# Patient Record
Sex: Male | Born: 1955 | Race: White | Hispanic: No | Marital: Married | Smoking: Never smoker
Health system: Southern US, Community
[De-identification: ages and names within clinical notes are randomized; demographics above are authoritative.]

## PROBLEM LIST (undated history)

## (undated) DIAGNOSIS — I10 Essential (primary) hypertension: Secondary | ICD-10-CM

## (undated) DIAGNOSIS — E785 Hyperlipidemia, unspecified: Secondary | ICD-10-CM

## (undated) HISTORY — PX: APPENDECTOMY: SHX54

## (undated) HISTORY — PX: TONSILLECTOMY: SUR1361

## (undated) HISTORY — PX: HERNIA REPAIR: SHX51

## (undated) HISTORY — PX: DIAGNOSTIC LAPAROSCOPY: SUR761

## (undated) HISTORY — PX: SHOULDER ARTHROSCOPY: SHX128

## (undated) HISTORY — PX: CHOLECYSTECTOMY: SHX55

---

## 1999-05-06 ENCOUNTER — Ambulatory Visit (HOSPITAL_COMMUNITY): Admission: RE | Admit: 1999-05-06 | Discharge: 1999-05-06 | Payer: Self-pay | Admitting: General Surgery

## 1999-05-06 ENCOUNTER — Encounter: Payer: Self-pay | Admitting: General Surgery

## 2000-03-15 ENCOUNTER — Encounter (INDEPENDENT_AMBULATORY_CARE_PROVIDER_SITE_OTHER): Payer: Self-pay | Admitting: *Deleted

## 2000-03-15 ENCOUNTER — Ambulatory Visit (HOSPITAL_BASED_OUTPATIENT_CLINIC_OR_DEPARTMENT_OTHER): Admission: RE | Admit: 2000-03-15 | Discharge: 2000-03-15 | Payer: Self-pay | Admitting: Orthopedic Surgery

## 2000-12-21 ENCOUNTER — Ambulatory Visit (HOSPITAL_BASED_OUTPATIENT_CLINIC_OR_DEPARTMENT_OTHER): Admission: RE | Admit: 2000-12-21 | Discharge: 2000-12-21 | Payer: Self-pay | Admitting: Orthopedic Surgery

## 2003-12-02 ENCOUNTER — Ambulatory Visit (HOSPITAL_COMMUNITY): Admission: RE | Admit: 2003-12-02 | Discharge: 2003-12-02 | Payer: Self-pay | Admitting: Orthopedic Surgery

## 2003-12-02 ENCOUNTER — Ambulatory Visit (HOSPITAL_BASED_OUTPATIENT_CLINIC_OR_DEPARTMENT_OTHER): Admission: RE | Admit: 2003-12-02 | Discharge: 2003-12-02 | Payer: Self-pay | Admitting: Orthopedic Surgery

## 2004-01-14 ENCOUNTER — Ambulatory Visit (HOSPITAL_COMMUNITY): Admission: RE | Admit: 2004-01-14 | Discharge: 2004-01-14 | Payer: Self-pay | Admitting: Gastroenterology

## 2004-01-14 ENCOUNTER — Encounter (INDEPENDENT_AMBULATORY_CARE_PROVIDER_SITE_OTHER): Payer: Self-pay | Admitting: Specialist

## 2006-07-19 ENCOUNTER — Encounter (INDEPENDENT_AMBULATORY_CARE_PROVIDER_SITE_OTHER): Payer: Self-pay | Admitting: Specialist

## 2006-07-19 ENCOUNTER — Ambulatory Visit (HOSPITAL_BASED_OUTPATIENT_CLINIC_OR_DEPARTMENT_OTHER): Admission: RE | Admit: 2006-07-19 | Discharge: 2006-07-19 | Payer: Self-pay | Admitting: Orthopedic Surgery

## 2010-11-14 ENCOUNTER — Ambulatory Visit
Admission: RE | Admit: 2010-11-14 | Discharge: 2010-11-14 | Payer: Self-pay | Source: Home / Self Care | Attending: General Surgery | Admitting: General Surgery

## 2011-04-21 NOTE — Op Note (Signed)
[place]. [facility]  Patient:    Donald Castaneda, Donald Castaneda                         MRN: [id number] Proc. Date: 03/15/00 Attending:  Nicki Reaper, M.D. Dictator:   Nicki Reaper, M.D. CC:         Nicki Reaper, M.D. 2 copies                           Operative Report  PREOPERATIVE DIAGNOSIS:  Mass, right middle finger.  POSTOPERATIVE DIAGNOSIS:  Mass, right middle finger.  OPERATION:  Excision of mass of right middle finger.  SURGEON:  Dr. Merlyn Lot.  ASSISTANT:  Talmadge Chad.  ANESTHESIA:  Metacarpal block, 1% Xylocaine, 0.25% Marcaine, 8 cc was used.  HISTORY OF PRESENT ILLNESS:  The patient is a 55 year old male with a history of a mass on the dorsal aspect of his finger which has gotten larger and smaller. He desires having this removed.  DESCRIPTION OF PROCEDURE:  The patient was brought to the operating room where  metacarpal block was given with 1% Xylocaine and 0.25% Marcaine, both without epinephrine.  He was prepped and draped using Duraprep.  A Penrose drain was used for tourniquet control at the base of the finger.  A curvilinear incision was made with an ellipse of the mass, carried down through the subcutaneous tissue with sharp dissection.  The entire mass was removed and sent to pathology.  The wound was irrigated.  The skin was closed with interrupted 5-0 Nylon suture after undermining the skin proximally.  A sterile compressive dressing and splint was  applied.  The patient tolerated the procedure well.  He was discharged home to return in one week on Tylenol No. 3 and Keflex. DD:  03/15/00 TD:  03/15/00 Job: [id number]

## 2011-04-21 NOTE — Op Note (Signed)
NAME:  Donald Castaneda, Donald Castaneda                          ACCOUNT NO.:  [id number]   MEDICAL RECORD NO.:  [id number]                   PATIENT TYPE:  AMB   LOCATION:  DSC                                  FACILITY:  [facility]   PHYSICIAN:  Feliberto Gottron. Turner Daniels, M.D.                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   POSTOPERATIVE DIAGNOSES:  Left shoulder impingement syndrome with  subacromial spur.   OPERATION PERFORMED:  Left shoulder arthroscopic anterior inferior  acromioplasty and debridement of incidental small labral tear superior  anterior.   SURGEON:  Feliberto Gottron. Turner Daniels, M.D.   ASSISTANT:  None.   ANESTHESIA:  Interscalene block plus general endotracheal.   ESTIMATED BLOOD LOSS:  Minimal.   FLUIDS REPLACED:  1L of crystalloid.   DRAINS:  None.   TOURNIQUET TIME:  None.   INDICATIONS FOR PROCEDURE:  The patient is a 55 year old gentleman whom I  have followed for left shoulder impingement syndrome for I believe three  years.  He has had a couple of cortisone injections with good temporary  response but the last one failed early.  The subacromial spur on plain x-  rays only about 0.5 cm in size but he has significant classic impingement  symptoms and again he has failed conservative treatment now for three years.  He desired elective arthroscopic anterior inferior acromioplasty, evaluation  of the rotator cuff although an MRI scan only showed a partial supraspinatus  insertion tear at worst.  Preoperatively, the patient was noted to have a  hemoglobin of 9, donated blood a couple of months ago so his hemoglobin was  above 11 and he is going to be worked up for this.   DESCRIPTION OF PROCEDURE:  The patient was identified by arm band and taken  to the operating room at [facility] where  appropriate  anesthetic monitors are attached and left shoulder interscalene block  anesthesia induced followed by general endotracheal anesthesia.  The left  upper extremity was then prepped and draped in the usual sterile fashion  from the wrist to the hemithorax.  Using a #11 blade, standard portals were  made 1.5 cm anterior to the acromioclavicular joint lateral to the junction  of the middle and posterior thirds of the acromion and posterior to the  posterolateral corner of the acromion process.  The inflow was placed  anteriorly, the arthroscope laterally and a 4.2 great white sucker shaver  posteriorly allowing subacromial bursectomy outlining the subacromial spur  which actually appeared to be about a full centimeter in size after it had  been cleaned of its soft tissues.  This was then removed with a 4.5 hooded  Vortex bur, decompressing the subacromial space which was then evaluated  showing a 5% partial thickness, supraspinatus insertion tear that was  debrided lightly with a 4.2 great white sucker shaver.  The [facility] joint was  noted to be relatively clean.  The arthroscope was then repositioned in the  glenohumeral joint which was normal except for a small superior anterior  labral tear, degenerative in nature, maybe 1 to 2% of the fibers were  removed incidentally.  The articular cartilage was in good condition.  The  biceps anchor was intact and the labrum overall was intact.  At this point  the shoulder was washed out with normal saline solution.  The arthroscopic  instruments were removed.  A dressing of Xeroform, 4 x 4 dressing sponges  and paper tape and a standard sling applied.  Patient awakened and taken to  the recovery room without difficulty.                                               Feliberto Gottron. Turner Daniels, M.D.    Ovid Curd  D:  12/02/2003  T:  12/02/2003  Job:  [id number]   cc:   Theressa Millard, M.D.  [address] [place]  [place] [address]  Fax: [phone]

## 2011-04-21 NOTE — Op Note (Signed)
[place]. [facility]  Patient:    Donald Castaneda, Donald Castaneda                       MRN: [id number] Proc. Date: 02/18/01 Adm. Date:  [id number] Disc. Date: [id number] Attending:  Alinda Deem                           Operative Report  PREOPERATIVE DIAGNOSIS:  Right medial meniscus tear.  POSTOPERATIVE DIAGNOSIS:  Right medial meniscus tear.  OPERATION PERFORMED:  Right knee partial arthroscopic medial meniscectomy.  SURGEON:  Alinda Deem, M.D.  ASSISTANT:  None.  ANESTHESIA:  Local with IV sedation.  ESTIMATED BLOOD LOSS:  Minimal.  FLUID REPLACEMENT:  500 cc crystalloid.  DRAINS:  None.  TOURNIQUET TIME:  None.  INDICATIONS FOR PROCEDURE:  The patient is a 55 year old man followed for right knee pain for many months with continued pain and tenderness along the medial joint line, positive McMurrays test, failed conservative treatment with anti-inflammatory medicines, physical therapy and rest and now desires right knee arthroscopic evaluation and presumed medial meniscectomy.  DESCRIPTION OF PROCEDURE:  The patient was identified by arm band and taken to the operating room at [facility] where the appropriate anesthetic monitors were attached and local anesthesia with IV sedation induced into the right knee.  A lateral post was applied to the operative table.  The lower extremity was prepped and draped in the usual sterile fashion from the ankle to the midthigh.  Using a #11 blade, standard inferomedial, inferolateral peripatellar portals were then made and the arthroscope was then introduced through the inferolateral portal, outflow through the inferomedial portal. The suprapatellar pouch, patella and trochlea were in excellent condition as were the cruciate ligaments and the lateral compartment along with the gutters.  On the medial side we identified a complex tear of the medial meniscus which was debrided back to a stable margin  using the the sucker shaver barracuda from [employer] as well as straight biters.  Satisfied with the trimming of the cartilage back to a stable margin, the knee was the washed out with normal saline solution.  The arthroscopic instruments were removed. A dressing of Xeroform, 4 x 8 dressing sponges, Webril and an Ace wrap applied.  The patient was awakened and taken to the recovery room without difficulty. DD:  02/18/01 TD:  02/18/01 Job: [id number]

## 2011-04-21 NOTE — Op Note (Signed)
NAMEELWARD, NOCERA                ACCOUNT NO.:  [id number]   MEDICAL RECORD NO.:  [id number]          PATIENT TYPE:  AMB   LOCATION:  DSC                          FACILITY:  [facility]   PHYSICIAN:  Cindee Salt, M.D.       DATE OF BIRTH:  08-01-1956   DATE OF PROCEDURE:  07/19/2006  DATE OF DISCHARGE:                                 OPERATIVE REPORT   PREOPERATIVE DIAGNOSIS:  Mass, right palm.   POSTOPERATIVE DIAGNOSIS:  Mass, right palm.   OPERATION:  Excision, mass, right palm.   SURGEON:  Merlyn Lot, M.D.   ASSISTANTCarolyne Fiscal, R.N.   ANESTHESIA:  Forearm-based IV regional.   HISTORY:  The patient is a 55 year old male with a history of a mass, palmar  aspect of his right hand on the index finger.  This has been uncomfortable  for him.  He is desirous of removal.  He is aware of risks and complications  including injury to arteries, nerves, tendons, incomplete relief of  symptoms, dystrophy and recurrence.  In the preoperative area, the mass was  marked by both the patient and surgeon.   PROCEDURE:  The patient was brought to operating room.  A forearm-based IV  regional anesthetic carried out without difficulty.  Was prepped using  DuraPrep, supine position, right arm free.  After a 3-minute dry time, he  was draped.  The oblique incision was made over the mass, carried down  through subcutaneous tissue.  A small pea-sized mass was immediately  encountered.  This appeared to be a fibrofatty type mass which was very  firm, well demarcated from the remaining fatty tissue.  This was removed and  sent to pathology.  The dissection was carried down through the palmar  fascia, the flexor tendon observed.  No ganglion cyst or other masses were  identified.  The wound was irrigated.  Skin closed with interrupted 5-0  nylon sutures.  Sterile compressive dressing was applied.  The patient  tolerated the procedure well and was taken to the recovery for observation  in satisfactory condition.   He is discharged home to return to the [facility] in 1 week on Vicodin.           ______________________________  Cindee Salt, M.D.     GK/MEDQ  D:  07/19/2006  T:  07/19/2006  Job:  [id number]

## 2011-04-21 NOTE — Op Note (Signed)
NAME:  Donald Castaneda, Donald Castaneda                          ACCOUNT NO.:  [id number]   MEDICAL RECORD NO.:  [id number]                   PATIENT TYPE:  AMB   LOCATION:  ENDO                                 FACILITY:  [facility]   PHYSICIAN:  Petra Kuba, M.D.                 DATE OF BIRTH:  1956/12/04   DATE OF PROCEDURE:  01/14/2004  DATE OF DISCHARGE:                                 OPERATIVE REPORT   PROCEDURE PERFORMED:  Esophagogastroduodenoscopy with biopsy.   ENDOSCOPIST:  Petra Kuba, M.D.   INDICATIONS FOR PROCEDURE:  Iron deficiency, nondiagnostic colonoscopy.  Consent was signed prior to any premeds given, after the risks, benefits,  methods and options were thoroughly discussed in the office.   MEDICINES USED:  No additional medicines were used for this procedure since  it followed the colonoscopy.   DESCRIPTION OF PROCEDURE:  The video endoscope was inserted by direct  vision.  Esophagus was normal.  Scope passed into the stomach through a  normal antrum and normal pylorus into a normal duodenal bulb and around the  C-loop to a normal second portion of the duodenum.  The ampulla was seen on  insertion.  Two distal duodenal biopsies were obtained and no abnormality  was seen.  The scope was withdrawn back to the bulb which again confirmed  its normal findings.  Scope was withdrawn back to the stomach and  retroflexed.  The angularis, cardia, fundus, lesser and greater curve were  normal on retroflex visualization.  Straight visualization in the stomach  did not reveal any additional findings.  Air was suctioned, scope was slowly  withdrawn.  Again, a good look at the esophagus was normal.  Scope was  removed.  The patient tolerated the procedure well.  There were no obvious  immediate complication.   ENDOSCOPIC DIAGNOSIS:  Essentially normal esophagogastroduodenoscopy, status  post duodenal biopsy to rule out malabsorption.   PLAN:  Go ahead and get a CBC and hemoglobin  electrophoresis.  Probably  begin iron.  Follow-up in six to eight weeks to recheck symptoms, CBC and  guaiac and decide any further work-up and plan like a small bowel follow-  through, CT or capsule endoscopy.                                               Petra Kuba, M.D.    MEM/MEDQ  D:  01/14/2004  T:  01/14/2004  Job:  [id number]   cc:   Theressa Millard, M.D.  [address] [place]  [place] [address]  Fax: [phone]

## 2011-04-21 NOTE — Op Note (Signed)
NAME:  Donald Castaneda, Donald Castaneda                          ACCOUNT NO.:  [id number]   MEDICAL RECORD NO.:  [id number]                   PATIENT TYPE:  AMB   LOCATION:  ENDO                                 FACILITY:  [facility]   PHYSICIAN:  Petra Kuba, M.D.                 DATE OF BIRTH:  [birth year]   DATE OF PROCEDURE:  01/14/2004  DATE OF DISCHARGE:                                 OPERATIVE REPORT   PROCEDURE PERFORMED:  Colonoscopy with polypectomy and hot biopsy.   ENDOSCOPIST:  Petra Kuba, M.D.   INDICATIONS FOR PROCEDURE:  Patient with iron deficiency.  Patient due for  colonic screening.  Consent was signed after the risks, benefits, methods and options were  thoroughly discussed in the office.   MEDICINES USED:  Demerol 70 mg, Versed 7 mg.   DESCRIPTION OF PROCEDURE:  Rectal inspection was pertinent for external  hemorrhoids, small.  Digital exam was negative.  A video pediatric  adjustable colonoscope was inserted and easily advanced to approximately the  midtransverse where a small polyp was seen and hot biopsied times one.  The  scope was then advanced to the cecum using abdominal pressure.  No other  abnormalities were seen on insertion.  No blood was seen.  The cecum was  identified by the appendiceal orifice and the ileocecal valve.  In fact the  scope was inserted a short ways into the terminal ileum which was normal.  Photodocumentation was obtained.  The scope was slowly withdrawn.  On slow  withdrawal through the colon, the cecum and the ascending were normal.  We  withdrew back to the previously seen polyp.  No obvious residual polyp was  seen.  There was a nice white coagulum without any bleeding.  The scope was  further withdrawn.  In the mid descending another tiny to small polyp was  seen and hot biopsied times one and put in a separate container.  The scope  was further withdrawn.  No additional findings were seen until we withdrew  back to the rectum where anorectal  pullthrough and retroflexion confirmed  some small hemorrhoids.  The scope was straightened and advanced a short  ways up the left side of the colon. Air was suctioned, scope removed.  The  patient tolerated the procedure well.  There was no immediate obvious  complication.   ENDOSCOPIC DIAGNOSIS:  1. Internal and external hemorrhoids.  2. Two small polyps descending and transverse hot biopsied.  3. Otherwise within normal limits to the terminal ileum.   PLAN:  Await pathology.  Probably recheck colon screening in five years.  Continue work-up with an EGD.  Please see that dictation for further work-up  and plans.  Petra Kuba, M.D.    MEM/MEDQ  D:  01/14/2004  T:  01/14/2004  Job:  [id number]   cc:   Theressa Millard, M.D.  [address] [place]  [place] [address]  Fax: [phone]

## 2014-02-26 ENCOUNTER — Other Ambulatory Visit: Payer: Self-pay | Admitting: Gastroenterology

## 2016-11-15 DIAGNOSIS — E7800 Pure hypercholesterolemia, unspecified: Secondary | ICD-10-CM | POA: Diagnosis not present

## 2016-11-15 DIAGNOSIS — Z0000 Encounter for general adult medical examination without abnormal findings: Secondary | ICD-10-CM | POA: Diagnosis not present

## 2016-11-15 DIAGNOSIS — D508 Other iron deficiency anemias: Secondary | ICD-10-CM | POA: Diagnosis not present

## 2016-11-15 DIAGNOSIS — Z125 Encounter for screening for malignant neoplasm of prostate: Secondary | ICD-10-CM | POA: Diagnosis not present

## 2016-12-01 DIAGNOSIS — H5213 Myopia, bilateral: Secondary | ICD-10-CM | POA: Diagnosis not present

## 2017-11-15 DIAGNOSIS — Z0000 Encounter for general adult medical examination without abnormal findings: Secondary | ICD-10-CM | POA: Diagnosis not present

## 2017-11-15 DIAGNOSIS — E7800 Pure hypercholesterolemia, unspecified: Secondary | ICD-10-CM | POA: Diagnosis not present

## 2017-11-15 DIAGNOSIS — Z125 Encounter for screening for malignant neoplasm of prostate: Secondary | ICD-10-CM | POA: Diagnosis not present

## 2017-11-15 DIAGNOSIS — D508 Other iron deficiency anemias: Secondary | ICD-10-CM | POA: Diagnosis not present

## 2017-12-05 DIAGNOSIS — H2513 Age-related nuclear cataract, bilateral: Secondary | ICD-10-CM | POA: Diagnosis not present

## 2017-12-05 DIAGNOSIS — H5213 Myopia, bilateral: Secondary | ICD-10-CM | POA: Diagnosis not present

## 2018-11-19 DIAGNOSIS — Z0000 Encounter for general adult medical examination without abnormal findings: Secondary | ICD-10-CM | POA: Diagnosis not present

## 2018-11-19 DIAGNOSIS — D508 Other iron deficiency anemias: Secondary | ICD-10-CM | POA: Diagnosis not present

## 2018-11-19 DIAGNOSIS — R030 Elevated blood-pressure reading, without diagnosis of hypertension: Secondary | ICD-10-CM | POA: Diagnosis not present

## 2018-11-19 DIAGNOSIS — E7800 Pure hypercholesterolemia, unspecified: Secondary | ICD-10-CM | POA: Diagnosis not present

## 2018-12-17 DIAGNOSIS — H5213 Myopia, bilateral: Secondary | ICD-10-CM | POA: Diagnosis not present

## 2018-12-17 DIAGNOSIS — H2513 Age-related nuclear cataract, bilateral: Secondary | ICD-10-CM | POA: Diagnosis not present

## 2018-12-25 DIAGNOSIS — R030 Elevated blood-pressure reading, without diagnosis of hypertension: Secondary | ICD-10-CM | POA: Diagnosis not present

## 2019-09-09 DIAGNOSIS — R69 Illness, unspecified: Secondary | ICD-10-CM | POA: Diagnosis not present

## 2019-10-16 DIAGNOSIS — R69 Illness, unspecified: Secondary | ICD-10-CM | POA: Diagnosis not present

## 2019-11-10 DIAGNOSIS — E7800 Pure hypercholesterolemia, unspecified: Secondary | ICD-10-CM | POA: Diagnosis not present

## 2019-11-10 DIAGNOSIS — Z8042 Family history of malignant neoplasm of prostate: Secondary | ICD-10-CM | POA: Diagnosis not present

## 2019-11-10 DIAGNOSIS — D508 Other iron deficiency anemias: Secondary | ICD-10-CM | POA: Diagnosis not present

## 2019-11-10 DIAGNOSIS — Z5181 Encounter for therapeutic drug level monitoring: Secondary | ICD-10-CM | POA: Diagnosis not present

## 2019-11-10 DIAGNOSIS — Z23 Encounter for immunization: Secondary | ICD-10-CM | POA: Diagnosis not present

## 2019-11-10 DIAGNOSIS — Z125 Encounter for screening for malignant neoplasm of prostate: Secondary | ICD-10-CM | POA: Diagnosis not present

## 2019-11-10 DIAGNOSIS — Z1389 Encounter for screening for other disorder: Secondary | ICD-10-CM | POA: Diagnosis not present

## 2019-11-10 DIAGNOSIS — Z0000 Encounter for general adult medical examination without abnormal findings: Secondary | ICD-10-CM | POA: Diagnosis not present

## 2019-12-12 DIAGNOSIS — M67911 Unspecified disorder of synovium and tendon, right shoulder: Secondary | ICD-10-CM | POA: Diagnosis not present

## 2019-12-23 DIAGNOSIS — H5213 Myopia, bilateral: Secondary | ICD-10-CM | POA: Diagnosis not present

## 2019-12-23 DIAGNOSIS — H2513 Age-related nuclear cataract, bilateral: Secondary | ICD-10-CM | POA: Diagnosis not present

## 2020-01-04 ENCOUNTER — Ambulatory Visit: Payer: Self-pay

## 2020-01-09 DIAGNOSIS — M25511 Pain in right shoulder: Secondary | ICD-10-CM | POA: Diagnosis not present

## 2020-01-10 ENCOUNTER — Ambulatory Visit: Payer: Medicare HMO | Attending: Internal Medicine

## 2020-01-10 DIAGNOSIS — Z23 Encounter for immunization: Secondary | ICD-10-CM | POA: Insufficient documentation

## 2020-01-10 NOTE — Progress Notes (Signed)
   Covid-19 Vaccination Clinic  Name:  MARVIN GRABILL    MRN: [id number] DOB: 30-Nov-1956  01/10/2020  Mr. Pflaum was observed post Covid-19 immunization for 15 minutes without incidence. He was provided with Vaccine Information Sheet and instruction to access the V-Safe system.   Mr. Roseboom was instructed to call 911 with any severe reactions post vaccine: Marland Kitchen Difficulty breathing  . Swelling of your face and throat  . A fast heartbeat  . A bad rash all over your body  . Dizziness and weakness    Immunizations Administered    Name Date Dose VIS Date Route   Pfizer COVID-19 Vaccine 01/10/2020 10:10 AM 0.3 mL 11/14/2019 Intramuscular   Manufacturer: [employer], [employer]   Lot: [id number]   NDC: [address]-5

## 2020-01-12 DIAGNOSIS — M75121 Complete rotator cuff tear or rupture of right shoulder, not specified as traumatic: Secondary | ICD-10-CM | POA: Diagnosis not present

## 2020-01-13 ENCOUNTER — Other Ambulatory Visit: Payer: Self-pay | Admitting: Orthopedic Surgery

## 2020-01-15 ENCOUNTER — Ambulatory Visit: Payer: Self-pay

## 2020-01-19 ENCOUNTER — Other Ambulatory Visit: Payer: Self-pay

## 2020-01-19 ENCOUNTER — Encounter (HOSPITAL_BASED_OUTPATIENT_CLINIC_OR_DEPARTMENT_OTHER): Payer: Self-pay | Admitting: Orthopedic Surgery

## 2020-01-22 ENCOUNTER — Other Ambulatory Visit (HOSPITAL_COMMUNITY): Admission: RE | Admit: 2020-01-22 | Payer: Medicare HMO | Source: Ambulatory Visit

## 2020-01-23 ENCOUNTER — Other Ambulatory Visit (HOSPITAL_COMMUNITY)
Admission: RE | Admit: 2020-01-23 | Discharge: 2020-01-23 | Disposition: A | Discharge status: Home / Self Care | Payer: Medicare HMO | Source: Ambulatory Visit | Attending: Orthopedic Surgery | Admitting: Orthopedic Surgery

## 2020-01-23 DIAGNOSIS — Z01812 Encounter for preprocedural laboratory examination: Secondary | ICD-10-CM | POA: Diagnosis not present

## 2020-01-23 DIAGNOSIS — Z20822 Contact with and (suspected) exposure to covid-19: Secondary | ICD-10-CM | POA: Insufficient documentation

## 2020-01-23 LAB — SARS CORONAVIRUS 2 (TAT 6-24 HRS): SARS Coronavirus 2: NEGATIVE

## 2020-01-26 ENCOUNTER — Ambulatory Visit (HOSPITAL_BASED_OUTPATIENT_CLINIC_OR_DEPARTMENT_OTHER): Payer: Medicare HMO | Admitting: Anesthesiology

## 2020-01-26 ENCOUNTER — Other Ambulatory Visit: Payer: Self-pay

## 2020-01-26 ENCOUNTER — Ambulatory Visit (HOSPITAL_BASED_OUTPATIENT_CLINIC_OR_DEPARTMENT_OTHER)
Admission: RE | Admit: 2020-01-26 | Discharge: 2020-01-26 | Disposition: A | Discharge status: Home / Self Care | Payer: Medicare HMO | Attending: Orthopedic Surgery | Admitting: Orthopedic Surgery

## 2020-01-26 ENCOUNTER — Encounter (HOSPITAL_BASED_OUTPATIENT_CLINIC_OR_DEPARTMENT_OTHER)
Admission: RE | Disposition: A | Discharge status: Home / Self Care | Payer: Self-pay | Source: Home / Self Care | Attending: Orthopedic Surgery

## 2020-01-26 ENCOUNTER — Encounter (HOSPITAL_BASED_OUTPATIENT_CLINIC_OR_DEPARTMENT_OTHER): Payer: Self-pay | Admitting: Orthopedic Surgery

## 2020-01-26 DIAGNOSIS — X58XXXA Exposure to other specified factors, initial encounter: Secondary | ICD-10-CM | POA: Diagnosis not present

## 2020-01-26 DIAGNOSIS — S46111A Strain of muscle, fascia and tendon of long head of biceps, right arm, initial encounter: Secondary | ICD-10-CM | POA: Insufficient documentation

## 2020-01-26 DIAGNOSIS — S43431A Superior glenoid labrum lesion of right shoulder, initial encounter: Secondary | ICD-10-CM | POA: Insufficient documentation

## 2020-01-26 DIAGNOSIS — M7541 Impingement syndrome of right shoulder: Secondary | ICD-10-CM | POA: Diagnosis not present

## 2020-01-26 DIAGNOSIS — M75121 Complete rotator cuff tear or rupture of right shoulder, not specified as traumatic: Secondary | ICD-10-CM | POA: Insufficient documentation

## 2020-01-26 DIAGNOSIS — Z79899 Other long term (current) drug therapy: Secondary | ICD-10-CM | POA: Diagnosis not present

## 2020-01-26 DIAGNOSIS — G8918 Other acute postprocedural pain: Secondary | ICD-10-CM | POA: Diagnosis not present

## 2020-01-26 DIAGNOSIS — E785 Hyperlipidemia, unspecified: Secondary | ICD-10-CM | POA: Diagnosis not present

## 2020-01-26 HISTORY — PX: SHOULDER ARTHROSCOPY WITH ROTATOR CUFF REPAIR AND SUBACROMIAL DECOMPRESSION: SHX5686

## 2020-01-26 HISTORY — DX: Hyperlipidemia, unspecified: E78.5

## 2020-01-26 SURGERY — SHOULDER ARTHROSCOPY WITH ROTATOR CUFF REPAIR AND SUBACROMIAL DECOMPRESSION
Anesthesia: General | Site: Shoulder | Laterality: Right

## 2020-01-26 MED ORDER — BUPIVACAINE-EPINEPHRINE (PF) 0.5% -1:200000 IJ SOLN
INTRAMUSCULAR | Status: DC | PRN
  Administered 2020-01-26: 15 mL via PERINEURAL

## 2020-01-26 MED ORDER — ACETAMINOPHEN 500 MG PO TABS
ORAL_TABLET | ORAL | Status: AC
  Filled 2020-01-26: qty 2

## 2020-01-26 MED ORDER — PROPOFOL 10 MG/ML IV BOLUS
INTRAVENOUS | Status: DC | PRN
  Administered 2020-01-26: 150 mg via INTRAVENOUS

## 2020-01-26 MED ORDER — FENTANYL CITRATE (PF) 100 MCG/2ML IJ SOLN
50.0000 ug | INTRAMUSCULAR | Status: DC | PRN
  Administered 2020-01-26: 50 ug via INTRAVENOUS

## 2020-01-26 MED ORDER — CEFAZOLIN SODIUM-DEXTROSE 2-4 GM/100ML-% IV SOLN
INTRAVENOUS | Status: AC
  Filled 2020-01-26: qty 100

## 2020-01-26 MED ORDER — DEXAMETHASONE SODIUM PHOSPHATE 4 MG/ML IJ SOLN
INTRAMUSCULAR | Status: DC | PRN
  Administered 2020-01-26: 4 mg via INTRAVENOUS

## 2020-01-26 MED ORDER — LIDOCAINE HCL (CARDIAC) PF 100 MG/5ML IV SOSY
PREFILLED_SYRINGE | INTRAVENOUS | Status: DC | PRN
  Administered 2020-01-26: 20 mg via INTRAVENOUS

## 2020-01-26 MED ORDER — TIZANIDINE HCL 4 MG PO TABS: 4.0000 mg | ORAL_TABLET | Freq: Three times a day (TID) | ORAL | 1 refills | Status: AC | PRN

## 2020-01-26 MED ORDER — CELECOXIB 200 MG PO CAPS
200.0000 mg | ORAL_CAPSULE | Freq: Once | ORAL | Status: AC
  Administered 2020-01-26: 200 mg via ORAL

## 2020-01-26 MED ORDER — MIDAZOLAM HCL 2 MG/2ML IJ SOLN
INTRAMUSCULAR | Status: AC
  Filled 2020-01-26: qty 2

## 2020-01-26 MED ORDER — MIDAZOLAM HCL 2 MG/2ML IJ SOLN
1.0000 mg | INTRAMUSCULAR | Status: DC | PRN
  Administered 2020-01-26: 11:00:00 2 mg via INTRAVENOUS

## 2020-01-26 MED ORDER — CEFAZOLIN SODIUM-DEXTROSE 2-4 GM/100ML-% IV SOLN
2.0000 g | INTRAVENOUS | Status: AC
  Administered 2020-01-26: 2 g via INTRAVENOUS

## 2020-01-26 MED ORDER — LACTATED RINGERS IV SOLN: INTRAVENOUS | Status: DC

## 2020-01-26 MED ORDER — BUPIVACAINE-EPINEPHRINE (PF) 0.5% -1:200000 IJ SOLN: INTRAMUSCULAR | Status: DC | PRN

## 2020-01-26 MED ORDER — BUPIVACAINE LIPOSOME 1.3 % IJ SUSP
INTRAMUSCULAR | Status: DC | PRN
  Administered 2020-01-26: 10 mL via PERINEURAL

## 2020-01-26 MED ORDER — ACETAMINOPHEN 500 MG PO TABS
1000.0000 mg | ORAL_TABLET | Freq: Once | ORAL | Status: AC
  Administered 2020-01-26: 1000 mg via ORAL

## 2020-01-26 MED ORDER — ONDANSETRON HCL 4 MG/2ML IJ SOLN
INTRAMUSCULAR | Status: AC
  Filled 2020-01-26: qty 2

## 2020-01-26 MED ORDER — PROPOFOL 10 MG/ML IV BOLUS
INTRAVENOUS | Status: AC
  Filled 2020-01-26: qty 20

## 2020-01-26 MED ORDER — LIDOCAINE 2% (20 MG/ML) 5 ML SYRINGE
INTRAMUSCULAR | Status: AC
  Filled 2020-01-26: qty 5

## 2020-01-26 MED ORDER — OXYCODONE-ACETAMINOPHEN 5-325 MG PO TABS: ORAL_TABLET | ORAL | 0 refills | Status: AC

## 2020-01-26 MED ORDER — ONDANSETRON HCL 4 MG/2ML IJ SOLN
INTRAMUSCULAR | Status: DC | PRN
  Administered 2020-01-26: 4 mg via INTRAVENOUS

## 2020-01-26 MED ORDER — FENTANYL CITRATE (PF) 100 MCG/2ML IJ SOLN
INTRAMUSCULAR | Status: AC
  Filled 2020-01-26: qty 2

## 2020-01-26 MED ORDER — ONDANSETRON HCL 4 MG/2ML IJ SOLN: 4.0000 mg | Freq: Once | INTRAMUSCULAR | Status: DC | PRN

## 2020-01-26 MED ORDER — FENTANYL CITRATE (PF) 100 MCG/2ML IJ SOLN: 25.0000 ug | INTRAMUSCULAR | Status: DC | PRN

## 2020-01-26 MED ORDER — CELECOXIB 200 MG PO CAPS
ORAL_CAPSULE | ORAL | Status: AC
  Filled 2020-01-26: qty 1

## 2020-01-26 MED ORDER — SODIUM CHLORIDE 0.9 % IR SOLN
Status: DC | PRN
  Administered 2020-01-26: 6000 mL

## 2020-01-26 MED ORDER — SUCCINYLCHOLINE CHLORIDE 20 MG/ML IJ SOLN
INTRAMUSCULAR | Status: DC | PRN
  Administered 2020-01-26: 100 mg via INTRAVENOUS

## 2020-01-26 MED ORDER — CHLORHEXIDINE GLUCONATE 4 % EX LIQD: 60.0000 mL | Freq: Once | CUTANEOUS | Status: DC

## 2020-01-26 MED ORDER — DEXAMETHASONE SODIUM PHOSPHATE 10 MG/ML IJ SOLN
INTRAMUSCULAR | Status: AC
  Filled 2020-01-26: qty 1

## 2020-01-26 SURGICAL SUPPLY — 59 items
AID PSTN UNV HD RSTRNT DISP (MISCELLANEOUS) ×1
ANCH SUT SWLK 19.1X4.75 VT (Anchor) ×4 IMPLANT
ANCHOR PEEK 4.75X19.1 SWLK C (Anchor) ×8 IMPLANT
APL PRP STRL LF DISP 70% ISPRP (MISCELLANEOUS) ×1
BENZOIN TINCTURE PRP APPL 2/3 (GAUZE/BANDAGES/DRESSINGS) IMPLANT
BURR OVAL 8 FLU 4.0X13 (MISCELLANEOUS) ×2 IMPLANT
CANNULA 5.75X7 CRYSTAL CLEAR (CANNULA) ×2 IMPLANT
CANNULA TWIST IN 8.25X7CM (CANNULA) ×2 IMPLANT
CHLORAPREP W/TINT 26 (MISCELLANEOUS) ×2 IMPLANT
COVER WAND RF STERILE (DRAPES) IMPLANT
CUTTER BONE 4.0MM X 13CM (MISCELLANEOUS) ×2 IMPLANT
DECANTER SPIKE VIAL GLASS SM (MISCELLANEOUS) IMPLANT
DRAPE IMP U-DRAPE 54X76 (DRAPES) ×2 IMPLANT
DRAPE INCISE IOBAN 66X45 STRL (DRAPES) ×2 IMPLANT
DRAPE STERI 35X30 U-POUCH (DRAPES) ×2 IMPLANT
DRAPE SURG 17X23 STRL (DRAPES) ×2 IMPLANT
DRAPE U-SHAPE 47X51 STRL (DRAPES) ×2 IMPLANT
DRAPE U-SHAPE 76X120 STRL (DRAPES) ×4 IMPLANT
DRSG PAD ABDOMINAL 8X10 ST (GAUZE/BANDAGES/DRESSINGS) ×2 IMPLANT
ELECT REM PT RETURN 9FT ADLT (ELECTROSURGICAL) ×2
ELECTRODE REM PT RTRN 9FT ADLT (ELECTROSURGICAL) ×1 IMPLANT
GAUZE 4X4 16PLY RFD (DISPOSABLE) IMPLANT
GAUZE SPONGE 4X4 12PLY STRL (GAUZE/BANDAGES/DRESSINGS) ×2 IMPLANT
GAUZE XEROFORM 1X8 LF (GAUZE/BANDAGES/DRESSINGS) ×2 IMPLANT
GLOVE BIO SURGEON STRL SZ7 (GLOVE) ×2 IMPLANT
GLOVE BIO SURGEON STRL SZ7.5 (GLOVE) ×2 IMPLANT
GLOVE BIOGEL PI IND STRL 7.0 (GLOVE) ×2 IMPLANT
GLOVE BIOGEL PI IND STRL 8 (GLOVE) ×1 IMPLANT
GLOVE BIOGEL PI INDICATOR 7.0 (GLOVE) ×2
GLOVE BIOGEL PI INDICATOR 8 (GLOVE) ×1
GLOVE ECLIPSE 6.5 STRL STRAW (GLOVE) ×2 IMPLANT
GOWN STRL REUS W/ TWL LRG LVL3 (GOWN DISPOSABLE) ×2 IMPLANT
GOWN STRL REUS W/ TWL XL LVL3 (GOWN DISPOSABLE) ×1 IMPLANT
GOWN STRL REUS W/TWL LRG LVL3 (GOWN DISPOSABLE) ×4
GOWN STRL REUS W/TWL XL LVL3 (GOWN DISPOSABLE) ×2
LASSO CRESCENT QUICKPASS (SUTURE) IMPLANT
MANIFOLD NEPTUNE II (INSTRUMENTS) ×2 IMPLANT
NEEDLE SCORPION MULTI FIRE (NEEDLE) ×2 IMPLANT
NS IRRIG 1000ML POUR BTL (IV SOLUTION) IMPLANT
PACK ARTHROSCOPY DSU (CUSTOM PROCEDURE TRAY) ×2 IMPLANT
PACK BASIN DAY SURGERY FS (CUSTOM PROCEDURE TRAY) ×2 IMPLANT
PROBE BIPOLAR ATHRO 135MM 90D (MISCELLANEOUS) ×2 IMPLANT
RESTRAINT HEAD UNIVERSAL NS (MISCELLANEOUS) ×2 IMPLANT
SLEEVE SCD COMPRESS KNEE MED (MISCELLANEOUS) ×2 IMPLANT
SLING ARM FOAM STRAP LRG (SOFTGOODS) ×2 IMPLANT
STRIP CLOSURE SKIN 1/2X4 (GAUZE/BANDAGES/DRESSINGS) IMPLANT
SUPPORT WRAP ARM LG (MISCELLANEOUS) IMPLANT
SUT ETHILON 3 0 PS 1 (SUTURE) ×2 IMPLANT
SUT FIBERWIRE #2 38 T-5 BLUE (SUTURE)
SUT PDS AB 0 CT 36 (SUTURE) IMPLANT
SUT TIGER TAPE 7 IN WHITE (SUTURE) IMPLANT
SUTURE FIBERWR #2 38 T-5 BLUE (SUTURE) IMPLANT
SUTURE TAPE 1.3 40 TPR END (SUTURE) ×2 IMPLANT
SUTURETAPE 1.3 40 TPR END (SUTURE) ×4
TAPE FIBER 2MM 7IN #2 BLUE (SUTURE) IMPLANT
TOWEL GREEN STERILE FF (TOWEL DISPOSABLE) ×2 IMPLANT
TUBE CONNECTING 20X1/4 (TUBING) ×2 IMPLANT
TUBING ARTHROSCOPY IRRIG 16FT (MISCELLANEOUS) ×2 IMPLANT
WATER STERILE IRR 1000ML POUR (IV SOLUTION) ×2 IMPLANT

## 2020-01-26 NOTE — Discharge Instructions (Signed)
**You had 1000 MG of Tylenol at 11:30 AM **You had Celebrex at 11:30 AM no Ibuprofen until 7:30 PM  Discharge Instructions after Arthroscopic Shoulder Repair   A sling has been provided for you. Remain in your sling at all times. This includes sleeping in your sling.  Use ice on the shoulder intermittently over the first 48 hours after surgery.  Pain medicine has been prescribed for you.  Use your medicine liberally over the first 48 hours, and then you can begin to taper your use. You may take Extra Strength Tylenol or Tylenol only in place of the pain pills. DO NOT take ANY nonsteroidal anti-inflammatory pain medications: Advil, Motrin, Ibuprofen, Aleve, Naproxen, or Narprosyn.  You may remove your dressing after two days. If the incision sites are still moist, place a Band-Aid over the moist site(s). Change Band-Aids daily until dry.  You may shower 5 days after surgery. The incisions CANNOT get wet prior to 5 days. Simply allow the water to wash over the site and then pat dry. Do not rub the incisions. Make sure your axilla (armpit) is completely dry after showering.  Take one aspirin a day for 2 weeks after surgery, unless you have an aspirin sensitivity/ allergy or asthma.   Please call [phone] during normal business hours or [phone] after hours for any problems. Including the following:  - excessive redness of the incisions - drainage for more than 4 days - fever of more than 101.5 F  *Please note that pain medications will not be refilled after hours or on weekends.   Post Anesthesia Home Care Instructions  Activity: Get plenty of rest for the remainder of the day. A responsible individual must stay with you for 24 hours following the procedure.  For the next 24 hours, DO NOT: -Drive a car -Advertising copywriter -Drink alcoholic beverages -Take any medication unless instructed by your physician -Make any legal decisions or sign important papers.  Meals: Start with  liquid foods such as gelatin or soup. Progress to regular foods as tolerated. Avoid greasy, spicy, heavy foods. If nausea and/or vomiting occur, drink only clear liquids until the nausea and/or vomiting subsides. Call your physician if vomiting continues.  Special Instructions/Symptoms: Your throat may feel dry or sore from the anesthesia or the breathing tube placed in your throat during surgery. If this causes discomfort, gargle with warm salt water. The discomfort should disappear within 24 hours.  If you had a scopolamine patch placed behind your ear for the management of post- operative nausea and/or vomiting:  1. The medication in the patch is effective for 72 hours, after which it should be removed.  Wrap patch in a tissue and discard in the trash. Wash hands thoroughly with soap and water. 2. You may remove the patch earlier than 72 hours if you experience unpleasant side effects which may include dry mouth, dizziness or visual disturbances. 3. Avoid touching the patch. Wash your hands with soap and water after contact with the patch.    Regional Anesthesia Blocks  1. Numbness or the inability to move the "blocked" extremity may last from 3-48 hours after placement. The length of time depends on the medication injected and your individual response to the medication. If the numbness is not going away after 48 hours, call your surgeon.  2. The extremity that is blocked will need to be protected until the numbness is gone and the  Strength has returned. Because you cannot feel it, you will need to take extra  care to avoid injury. Because it may be weak, you may have difficulty moving it or using it. You may not know what position it is in without looking at it while the block is in effect.  3. For blocks in the legs and feet, returning to weight bearing and walking needs to be done carefully. You will need to wait until the numbness is entirely gone and the strength has returned. You should be  able to move your leg and foot normally before you try and bear weight or walk. You will need someone to be with you when you first try to ensure you do not fall and possibly risk injury.  4. Bruising and tenderness at the needle site are common side effects and will resolve in a few days.  5. Persistent numbness or new problems with movement should be communicated to the surgeon or the [place] [id number] [place] [phone]).Information for Discharge Teaching: EXPAREL (bupivacaine liposome injectable suspension)   Your surgeon or anesthesiologist gave you EXPAREL(bupivacaine) to help control your pain after surgery.   EXPAREL is a local anesthetic that provides pain relief by numbing the tissue around the surgical site.  EXPAREL is designed to release pain medication over time and can control pain for up to 72 hours.  Depending on how you respond to EXPAREL, you may require less pain medication during your recovery.  Possible side effects:  Temporary loss of sensation or ability to move in the area where bupivacaine was injected.  Nausea, vomiting, constipation  Rarely, numbness and tingling in your mouth or lips, lightheadedness, or anxiety may occur.  Call your doctor right away if you think you may be experiencing any of these sensations, or if you have other questions regarding possible side effects.  Follow all other discharge instructions given to you by your surgeon or nurse. Eat a healthy diet and drink plenty of water or other fluids.  If you return to the hospital for any reason within 96 hours following the administration of EXPAREL, it is important for health care providers to know that you have received this anesthetic. A teal colored band has been placed on your arm with the date, time and amount of EXPAREL you have received in order to alert and inform your health care providers. Please leave this armband in place for the full 96  hours following administration, and then you may remove the band.

## 2020-01-26 NOTE — H&P (Signed)
Donald Castaneda is an 64 y.o. male.   Chief Complaint: R shoulder pain  HPI: R shoulder RCT, failed conservative treatment.  Past Medical History:  Diagnosis Date  . Hyperlipidemia     Past Surgical History:  Procedure Laterality Date  . APPENDECTOMY    . CHOLECYSTECTOMY    . DIAGNOSTIC LAPAROSCOPY    . HERNIA REPAIR    . SHOULDER ARTHROSCOPY Left   . TONSILLECTOMY      History reviewed. No pertinent family history. Social History:  reports that he has never smoked. He has never used smokeless tobacco. He reports that he does not drink alcohol or use drugs.  Allergies: No Known Allergies  Medications Prior to Admission  Medication Sig Dispense Refill  . atorvastatin (LIPITOR) 10 MG tablet Take 10 mg by mouth daily.      No results found for this or any previous visit (from the past 48 hour(s)). No results found.  Review of Systems  All other systems reviewed and are negative.   Blood pressure 132/77, pulse 88, resp. rate 19, height 5\' 9"  (1.753 m), weight 78.7 kg, SpO2 98 %. Physical Exam  Constitutional: He is oriented to person, place, and time. He appears well-developed and well-nourished.  HENT:  Head: Atraumatic.  Eyes: EOM are normal.  Cardiovascular: Intact distal pulses.  Respiratory: Effort normal.  Musculoskeletal:     Comments: R shoulder pain and weakness with RC testing.  Neurological: He is alert and oriented to person, place, and time.  Skin: Skin is warm and dry.  Psychiatric: He has a normal mood and affect.     Assessment/Plan R shoulder RCT, failed conservative treatment. Plan R RCR/SAD Risks / benefits of surgery discussed Consent on chart  NPO for OR Preop antibiotics   , MD 01/26/2020, 12:06 PM

## 2020-01-26 NOTE — Anesthesia Procedure Notes (Signed)
Anesthesia Regional Block: Interscalene brachial plexus block   Pre-Anesthetic Checklist: ,, timeout performed, Correct Patient, Correct Site, Correct Laterality, Correct Procedure, Correct Position, site marked, Risks and benefits discussed,  Surgical consent,  Pre-op evaluation,  At surgeon's request and post-op pain management  Laterality: Right  Prep: chloraprep       Needles:  Injection technique: Single-shot  Needle Type: Echogenic Needle     Needle Length: 9cm  Needle Gauge: 21     Additional Needles:   Procedures:, nerve stimulator,,, ultrasound used (permanent image in chart),,,,   Nerve Stimulator or Paresthesia:  Response: deltoid and bicep, 0.5 mA,   Additional Responses:   Narrative:  Start time: 01/26/2020 11:15 AM End time: 01/26/2020 11:22 AM Injection made incrementally with aspirations every 5 mL.  Performed by: Personally  Anesthesiologist: Marcene Duos, MD

## 2020-01-26 NOTE — Anesthesia Postprocedure Evaluation (Signed)
Anesthesia Post Note  Patient: LINKEN MCGLOTHEN  Procedure(s) Performed: SHOULDER ARTHROSCOPY WITH ROTATOR CUFF REPAIR AND SUBACROMIAL DECOMPRESSION, BICEPS TENTOMY (Right Shoulder)     Patient location during evaluation: PACU Anesthesia Type: General Level of consciousness: awake and alert Pain management: pain level controlled Vital Signs Assessment: post-procedure vital signs reviewed and stable Respiratory status: spontaneous breathing, nonlabored ventilation, respiratory function stable and patient connected to nasal cannula oxygen Cardiovascular status: blood pressure returned to baseline and stable Postop Assessment: no apparent nausea or vomiting Anesthetic complications: no    Last Vitals:  Vitals:   01/26/20 1400 01/26/20 1430  BP: 132/83 134/82  Pulse: 91 84  Resp: 16 18  Temp:  (!) 36.4 C  SpO2: 95% 96%    Last Pain:  Vitals:   01/26/20 1430  PainSc: 0-No pain                 Kennieth Rad

## 2020-01-26 NOTE — Anesthesia Procedure Notes (Signed)
Procedure Name: Intubation Date/Time: 01/26/2020 12:23 PM Performed by: Maryella Shivers, CRNA Pre-anesthesia Checklist: Patient identified, Emergency Drugs available, Suction available and Patient being monitored Patient Re-evaluated:Patient Re-evaluated prior to induction Oxygen Delivery Method: Circle system utilized Preoxygenation: Pre-oxygenation with 100% oxygen Induction Type: IV induction Ventilation: Mask ventilation without difficulty Laryngoscope Size: Mac and 3 Grade View: Grade I Tube type: Oral Number of attempts: 1 Airway Equipment and Method: Stylet and Oral airway Placement Confirmation: ETT inserted through vocal cords under direct vision,  positive ETCO2 and breath sounds checked- equal and bilateral Secured at: 21 cm Tube secured with: Tape Dental Injury: Teeth and Oropharynx as per pre-operative assessment

## 2020-01-26 NOTE — Anesthesia Preprocedure Evaluation (Signed)
Anesthesia Evaluation  Patient identified by MRN, date of birth, ID band Patient awake    Reviewed: Allergy & Precautions, NPO status , Patient's Chart, lab work & pertinent test results  Airway Mallampati: II  TM Distance: >3 FB Neck ROM: Full    Dental  (+) Dental Advisory Given   Pulmonary neg pulmonary ROS,    breath sounds clear to auscultation       Cardiovascular negative cardio ROS   Rhythm:Regular Rate:Normal     Neuro/Psych negative neurological ROS     GI/Hepatic negative GI ROS, Neg liver ROS,   Endo/Other  negative endocrine ROS  Renal/GU negative Renal ROS     Musculoskeletal   Abdominal   Peds  Hematology negative hematology ROS (+)   Anesthesia Other Findings   Reproductive/Obstetrics                             Anesthesia Physical Anesthesia Plan  ASA: I  Anesthesia Plan: General   Post-op Pain Management:  Regional for Post-op pain   Induction: Intravenous  PONV Risk Score and Plan: 2 and Dexamethasone, Ondansetron and Treatment may vary due to age or medical condition  Airway Management Planned: Oral ETT  Additional Equipment: None  Intra-op Plan:   Post-operative Plan: Extubation in OR  Informed Consent: I have reviewed the patients History and Physical, chart, labs and discussed the procedure including the risks, benefits and alternatives for the proposed anesthesia with the patient or authorized representative who has indicated his/her understanding and acceptance.     Dental advisory given  Plan Discussed with: CRNA  Anesthesia Plan Comments:         Anesthesia Quick Evaluation

## 2020-01-26 NOTE — Progress Notes (Signed)
Assisted Dr. Rob Fitzgerald with right, ultrasound guided, interscalene  block. Side rails up, monitors on throughout procedure. See vital signs in flow sheet. Tolerated Procedure well. 

## 2020-01-26 NOTE — Op Note (Signed)
Procedure(s): SHOULDER ARTHROSCOPY WITH ROTATOR CUFF REPAIR AND SUBACROMIAL DECOMPRESSION, BICEPS TENTOMY Procedure Note  Donald Castaneda male 64 y.o. 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  Postoperative diagnosis: #1 and 2 are same #3 proximal long head biceps partial tear with type II SLAP tear  Procedure(s) and Anesthesia Type: #1 right shoulder arthroscopic rotator cuff repair #2 right shoulder arthroscopic subacromial decompression #3 right shoulder proximal biceps tenotomy  Surgeon(s) and Role:    Tania Ade, MD - Primary     Surgeon: Isabella Stalling   Assistants: Jeanmarie Hubert PA-C (Danielle was present and scrubbed throughout the procedure and was essential in positioning, assisting with the camera and instrumentation,, and closure)  Anesthesia: General endotracheal anesthesia    Procedure Detail  SHOULDER ARTHROSCOPY WITH ROTATOR CUFF REPAIR AND SUBACROMIAL DECOMPRESSION, BICEPS TENTOMY  Estimated Blood Loss: Min         Drains: none  Blood Given: none         Specimens: none        Complications:  * No complications entered in OR log *         Disposition: PACU - hemodynamically stable.         Condition: stable    Procedure:   INDICATIONS FOR SURGERY: The patient is 64 y.o. male who has had a long history of right shoulder pain which is failed extensive conservative management.  He was found on MRI to have a full-thickness rotator cuff tear.  Ultimately indicated for surgical treatment to decrease pain and restore function.  OPERATIVE FINDINGS: Examination under anesthesia: No stiffness or instability  DESCRIPTION OF PROCEDURE: The patient was identified in preoperative  holding area where I personally marked the operative site after  verifying site, side, and procedure with the patient. An interscalene  block was given by the attending anesthesiologist the holding area.  The patient was taken back to the operating room where general anesthesia was induced without complication and was placed in the beach-chair position with the back  elevated about 60 degrees and all extremities and head and neck carefully padded and  positioned.   The right upper extremity was then prepped and  draped in a standard sterile fashion. The appropriate time-out  procedure was carried out. The patient did receive IV antibiotics  within 30 minutes of incision.   A small posterior portal incision was made and the arthroscope was introduced into the joint. An anterior portal was then established above the subscapularis using needle localization. Small cannula was placed anteriorly. Diagnostic arthroscopy was then carried out.  He was immediately noted to have extensive tearing of the superior labrum.  This was debrided back but with probing it was noted that the superior labrum was completely detached from the superior glenoid including the entire biceps anchor.  The proximal biceps was also noted to have extensive tearing involving more than 50% of the thickness of the tendon.  This was felt to be a contributor to his pain and therefore through a small anterior incision curved Mayo scissors were used to release the biceps from its origin and it was allowed to retract from the joint.  The superior labrum was debrided back to stable base.  The arthroscope was then introduced into the subacromial space a standard lateral portal was established with needle localization. The shaver was used through the lateral portal to perform extensive bursectomy. Coracoacromial ligament  was examined and found to be frayed indicating chronic impingement.  Bursal surface of the rotator cuff tear was identified.  Involve the entire supraspinatus insertion.  There was not significant retraction.  The tendon was debrided back to healthy edge.   The tuberosity was debrided down to a bleeding surface to promote healing.  The repair was then carried out after moving cannula to a posterior lateral viewing portal and placing a large cannula laterally for the repair.  2 4.75 peek swivel lock anchors were placed anterior and posterior just off the articular margin preloaded with fiber tape.  These 4 suture strands were then passed through the tear front to back and brought over to 2 additional swivel lock anchors laterally.  This brought the tendon nicely down over the tuberosity.  The #2 FiberWire sutures from the lateral anchors were used add additional fixation between the fiber tapes further securing the tendon down.  At this point the repair was felt to be complete and watertight.  The coracoacromial ligament was taken down off the anterior acromion with the ArthroCare exposing a moderate hooked anterior acromial spur. A high-speed bur was then used through the lateral portal to take down the anterior acromial spur from lateral to medial in a standard acromioplasty.  The acromioplasty was also viewed from the lateral portal and the bur was used as necessary to ensure that the acromion was completely flat from posterior to anterior.  The arthroscopic equipment was removed from the joint and the portals were closed with 3-0 nylon in an interrupted fashion. Sterile dressings were then applied including Xeroform 4 x 4's ABDs and tape. The patient was then allowed to awaken from general anesthesia, placed in a sling, transferred to the stretcher and taken to the recovery room in stable condition.   POSTOPERATIVE PLAN: The patient will be discharged home today and will followup in one week for suture removal and wound check.  He will follow the standard cuff protocol.

## 2020-01-26 NOTE — Transfer of Care (Signed)
Immediate Anesthesia Transfer of Care Note  Patient: JERVIS TRAPANI  Procedure(s) Performed: SHOULDER ARTHROSCOPY WITH ROTATOR CUFF REPAIR AND SUBACROMIAL DECOMPRESSION, BICEPS TENTOMY (Right Shoulder)  Patient Location: PACU  Anesthesia Type:GA combined with regional for post-op pain  Level of Consciousness: sedated  Airway & Oxygen Therapy: Patient Spontanous Breathing and Patient connected to nasal cannula oxygen  Post-op Assessment: Report given to RN and Post -op Vital signs reviewed and stable  Post vital signs: Reviewed and stable  Last Vitals:  Vitals Value Taken Time  BP 132/78 01/26/20 1337  Temp    Pulse 93 01/26/20 1341  Resp 34 01/26/20 1341  SpO2 98 % 01/26/20 1341  Vitals shown include unvalidated device data.  Last Pain:  Vitals:   01/26/20 1028  PainSc: 0-No pain      Patients Stated Pain Goal: 7 (01/26/20 1028)  Complications: No apparent anesthesia complications

## 2020-01-27 ENCOUNTER — Encounter: Payer: Self-pay | Admitting: *Deleted

## 2020-02-03 ENCOUNTER — Ambulatory Visit: Payer: Medicare HMO | Attending: Internal Medicine

## 2020-02-03 DIAGNOSIS — Z23 Encounter for immunization: Secondary | ICD-10-CM

## 2020-02-03 NOTE — Progress Notes (Signed)
   Covid-19 Vaccination Clinic  Name:  Donald Castaneda    MRN: [id number] DOB: [birth year]  02/03/2020  [name] was observed post Covid-19 immunization for 15 minutes without incident. He was provided with Vaccine Information Sheet and instruction to access the V-Safe system.   [name] was instructed to call 911 with any severe reactions post vaccine: Marland Kitchen Difficulty breathing  . Swelling of face and throat  . A fast heartbeat  . A bad rash all over body  . Dizziness and weakness   Immunizations Administered    Name Date Dose VIS Date Route   Pfizer COVID-19 Vaccine 02/03/2020  8:55 AM 0.3 mL 11/14/2019 Intramuscular   Manufacturer: [employer], [employer]   Lot: [id number]   NDC: [address]-8

## 2020-02-05 DIAGNOSIS — M25611 Stiffness of right shoulder, not elsewhere classified: Secondary | ICD-10-CM | POA: Diagnosis not present

## 2020-02-05 DIAGNOSIS — M75101 Unspecified rotator cuff tear or rupture of right shoulder, not specified as traumatic: Secondary | ICD-10-CM | POA: Diagnosis not present

## 2020-02-10 DIAGNOSIS — M75101 Unspecified rotator cuff tear or rupture of right shoulder, not specified as traumatic: Secondary | ICD-10-CM | POA: Diagnosis not present

## 2020-02-10 DIAGNOSIS — M25611 Stiffness of right shoulder, not elsewhere classified: Secondary | ICD-10-CM | POA: Diagnosis not present

## 2020-02-12 DIAGNOSIS — M25611 Stiffness of right shoulder, not elsewhere classified: Secondary | ICD-10-CM | POA: Diagnosis not present

## 2020-02-12 DIAGNOSIS — M75101 Unspecified rotator cuff tear or rupture of right shoulder, not specified as traumatic: Secondary | ICD-10-CM | POA: Diagnosis not present

## 2020-02-16 DIAGNOSIS — M25611 Stiffness of right shoulder, not elsewhere classified: Secondary | ICD-10-CM | POA: Diagnosis not present

## 2020-02-16 DIAGNOSIS — M75101 Unspecified rotator cuff tear or rupture of right shoulder, not specified as traumatic: Secondary | ICD-10-CM | POA: Diagnosis not present

## 2020-02-18 DIAGNOSIS — M75101 Unspecified rotator cuff tear or rupture of right shoulder, not specified as traumatic: Secondary | ICD-10-CM | POA: Diagnosis not present

## 2020-02-18 DIAGNOSIS — M25611 Stiffness of right shoulder, not elsewhere classified: Secondary | ICD-10-CM | POA: Diagnosis not present

## 2020-02-23 DIAGNOSIS — M75101 Unspecified rotator cuff tear or rupture of right shoulder, not specified as traumatic: Secondary | ICD-10-CM | POA: Diagnosis not present

## 2020-02-23 DIAGNOSIS — M25611 Stiffness of right shoulder, not elsewhere classified: Secondary | ICD-10-CM | POA: Diagnosis not present

## 2020-02-25 DIAGNOSIS — M75101 Unspecified rotator cuff tear or rupture of right shoulder, not specified as traumatic: Secondary | ICD-10-CM | POA: Diagnosis not present

## 2020-02-25 DIAGNOSIS — M25611 Stiffness of right shoulder, not elsewhere classified: Secondary | ICD-10-CM | POA: Diagnosis not present

## 2020-03-01 DIAGNOSIS — M75101 Unspecified rotator cuff tear or rupture of right shoulder, not specified as traumatic: Secondary | ICD-10-CM | POA: Diagnosis not present

## 2020-03-01 DIAGNOSIS — M25611 Stiffness of right shoulder, not elsewhere classified: Secondary | ICD-10-CM | POA: Diagnosis not present

## 2020-03-04 DIAGNOSIS — M25611 Stiffness of right shoulder, not elsewhere classified: Secondary | ICD-10-CM | POA: Diagnosis not present

## 2020-03-04 DIAGNOSIS — M75101 Unspecified rotator cuff tear or rupture of right shoulder, not specified as traumatic: Secondary | ICD-10-CM | POA: Diagnosis not present

## 2020-03-08 DIAGNOSIS — M75101 Unspecified rotator cuff tear or rupture of right shoulder, not specified as traumatic: Secondary | ICD-10-CM | POA: Diagnosis not present

## 2020-03-08 DIAGNOSIS — M25611 Stiffness of right shoulder, not elsewhere classified: Secondary | ICD-10-CM | POA: Diagnosis not present

## 2020-03-10 DIAGNOSIS — M25611 Stiffness of right shoulder, not elsewhere classified: Secondary | ICD-10-CM | POA: Diagnosis not present

## 2020-03-10 DIAGNOSIS — M75101 Unspecified rotator cuff tear or rupture of right shoulder, not specified as traumatic: Secondary | ICD-10-CM | POA: Diagnosis not present

## 2020-03-18 DIAGNOSIS — M75101 Unspecified rotator cuff tear or rupture of right shoulder, not specified as traumatic: Secondary | ICD-10-CM | POA: Diagnosis not present

## 2020-03-18 DIAGNOSIS — M25611 Stiffness of right shoulder, not elsewhere classified: Secondary | ICD-10-CM | POA: Diagnosis not present

## 2020-03-23 DIAGNOSIS — M25611 Stiffness of right shoulder, not elsewhere classified: Secondary | ICD-10-CM | POA: Diagnosis not present

## 2020-03-23 DIAGNOSIS — M75101 Unspecified rotator cuff tear or rupture of right shoulder, not specified as traumatic: Secondary | ICD-10-CM | POA: Diagnosis not present

## 2020-03-25 DIAGNOSIS — M25611 Stiffness of right shoulder, not elsewhere classified: Secondary | ICD-10-CM | POA: Diagnosis not present

## 2020-03-25 DIAGNOSIS — M75101 Unspecified rotator cuff tear or rupture of right shoulder, not specified as traumatic: Secondary | ICD-10-CM | POA: Diagnosis not present

## 2020-03-30 DIAGNOSIS — M75101 Unspecified rotator cuff tear or rupture of right shoulder, not specified as traumatic: Secondary | ICD-10-CM | POA: Diagnosis not present

## 2020-03-30 DIAGNOSIS — M25611 Stiffness of right shoulder, not elsewhere classified: Secondary | ICD-10-CM | POA: Diagnosis not present

## 2020-03-31 ENCOUNTER — Encounter (HOSPITAL_COMMUNITY): Payer: Self-pay | Admitting: Emergency Medicine

## 2020-03-31 ENCOUNTER — Other Ambulatory Visit: Payer: Self-pay

## 2020-03-31 ENCOUNTER — Emergency Department (HOSPITAL_COMMUNITY): Payer: Medicare HMO

## 2020-03-31 ENCOUNTER — Emergency Department (HOSPITAL_COMMUNITY)
Admission: EM | Admit: 2020-03-31 | Discharge: 2020-03-31 | Disposition: A | Discharge status: Home / Self Care | Payer: Medicare HMO | Attending: Emergency Medicine | Admitting: Emergency Medicine

## 2020-03-31 DIAGNOSIS — Z87891 Personal history of nicotine dependence: Secondary | ICD-10-CM | POA: Diagnosis not present

## 2020-03-31 DIAGNOSIS — Y929 Unspecified place or not applicable: Secondary | ICD-10-CM | POA: Insufficient documentation

## 2020-03-31 DIAGNOSIS — R000 Tachycardia, unspecified: Secondary | ICD-10-CM | POA: Diagnosis not present

## 2020-03-31 DIAGNOSIS — S51801A Unspecified open wound of right forearm, initial encounter: Secondary | ICD-10-CM | POA: Insufficient documentation

## 2020-03-31 DIAGNOSIS — I10 Essential (primary) hypertension: Secondary | ICD-10-CM | POA: Diagnosis not present

## 2020-03-31 DIAGNOSIS — R52 Pain, unspecified: Secondary | ICD-10-CM | POA: Diagnosis not present

## 2020-03-31 DIAGNOSIS — Y9302 Activity, running: Secondary | ICD-10-CM | POA: Insufficient documentation

## 2020-03-31 DIAGNOSIS — W540XXA Bitten by dog, initial encounter: Secondary | ICD-10-CM | POA: Diagnosis not present

## 2020-03-31 DIAGNOSIS — R69 Illness, unspecified: Secondary | ICD-10-CM | POA: Diagnosis not present

## 2020-03-31 DIAGNOSIS — Y999 Unspecified external cause status: Secondary | ICD-10-CM | POA: Diagnosis not present

## 2020-03-31 DIAGNOSIS — S51852A Open bite of left forearm, initial encounter: Secondary | ICD-10-CM | POA: Diagnosis not present

## 2020-03-31 MED ORDER — AMOXICILLIN-POT CLAVULANATE 875-125 MG PO TABS: 1.0000 | ORAL_TABLET | Freq: Two times a day (BID) | ORAL | 0 refills | Status: AC

## 2020-03-31 MED ORDER — HYDROCODONE-ACETAMINOPHEN 5-325 MG PO TABS
1.0000 | ORAL_TABLET | Freq: Once | ORAL | Status: AC
  Administered 2020-03-31: 18:00:00 1 via ORAL
  Filled 2020-03-31: qty 1

## 2020-03-31 MED ORDER — HYDROCODONE-ACETAMINOPHEN 5-325 MG PO TABS: 1.0000 | ORAL_TABLET | Freq: Four times a day (QID) | ORAL | 0 refills | Status: AC | PRN

## 2020-03-31 MED ORDER — LIDOCAINE HCL (PF) 1 % IJ SOLN
30.0000 mL | Freq: Once | INTRAMUSCULAR | Status: AC
  Administered 2020-03-31: 30 mL
  Filled 2020-03-31: qty 30

## 2020-03-31 NOTE — ED Notes (Signed)
Patient verbalizes understanding of discharge instructions. Opportunity for questioning and answers were provided. Armband removed by staff, pt discharged from ED ambulatory w/ wife 

## 2020-03-31 NOTE — ED Triage Notes (Signed)
Pt BIB [facility] from home, reports he was bitten by a dog to the left forearm, bleeding controlled at this time. Pt states tetanus is up to date and dog's rabies vaccination up to date at well. EMS VS: BP 168/90, HR 130

## 2020-03-31 NOTE — ED Provider Notes (Signed)
[facility] EMERGENCY DEPARTMENT Provider Note   CSN: [id number] Arrival date & time: 03/31/20  1616     History Chief Complaint  Patient presents with  . Animal Bite    Donald Castaneda is a 64 y.o. male.  Patient arrives via EMS. Patient was bitten on the left forearm by a neighbor's pit bull around 1500 this afternoon. He was running past the neighbor's yard when the dog "appeared out of nowhere". The dog is a rescue, immunizations are reportedly up to date. Patient's tetanus is up to date. Bleeding currently controlled via dressing applied by first responders.  The history is provided by the patient and the EMS personnel. No language interpreter was used.  Animal Bite Contact animal:  Dog Location:  Shoulder/arm Shoulder/arm injury location:  L forearm Pain details:    Quality:  Burning and sore   Severity:  Moderate   Timing:  Constant   Progression:  Unchanged Incident location:  Outside Provoked: unprovoked   Animal's rabies vaccination status:  Up to date Animal in possession: yes   Tetanus status:  Up to date      Past Medical History:  Diagnosis Date  . Hyperlipidemia     There are no problems to display for this patient.   Past Surgical History:  Procedure Laterality Date  . APPENDECTOMY    . CHOLECYSTECTOMY    . DIAGNOSTIC LAPAROSCOPY    . HERNIA REPAIR    . SHOULDER ARTHROSCOPY Left   . SHOULDER ARTHROSCOPY WITH ROTATOR CUFF REPAIR AND SUBACROMIAL DECOMPRESSION Right 01/26/2020   Procedure: SHOULDER ARTHROSCOPY WITH ROTATOR CUFF REPAIR AND SUBACROMIAL DECOMPRESSION, BICEPS TENTOMY;  Surgeon: Jones Broom, MD;  Location: [facility];  Service: Orthopedics;  Laterality: Right;  . TONSILLECTOMY         No family history on file.  Social History   Tobacco Use  . Smoking status: Never Smoker  . Smokeless tobacco: Never Used  Substance Use Topics  . Alcohol use: Never  . Drug use: Never    Home  Medications Prior to Admission medications   Medication Sig Start Date End Date Taking? Authorizing Provider  atorvastatin (LIPITOR) 10 MG tablet Take 10 mg by mouth daily.    [provider]  oxyCODONE-acetaminophen (PERCOCET) 5-325 MG tablet Take 1-2 tablets every 4 hours as needed for post operative pain. MAX 6/day 01/26/20   Jiles Harold, PA-C  tiZANidine (ZANAFLEX) 4 MG tablet Take 1 tablet (4 mg total) by mouth every 8 (eight) hours as needed for muscle spasms. 01/26/20   Jiles Harold, PA-C    Allergies    Patient has no known allergies.  Review of Systems   Review of Systems  Skin: Positive for wound.  All other systems reviewed and are negative.   Physical Exam Updated Vital Signs There were no vitals taken for this visit.  Physical Exam Vitals and nursing note reviewed.  Constitutional:      Appearance: Normal appearance.  HENT:     Head: Normocephalic.     Mouth/Throat:     Mouth: Mucous membranes are moist.  Eyes:     Conjunctiva/sclera: Conjunctivae normal.  Cardiovascular:     Rate and Rhythm: Normal rate and regular rhythm.  Pulmonary:     Effort: Pulmonary effort is normal.     Breath sounds: Normal breath sounds.  Musculoskeletal:        General: Signs of injury present. Normal range of motion.     Cervical back: Neck  supple.  Skin:    General: Skin is warm and dry.  Neurological:     Mental Status: He is alert and oriented to person, place, and time.  Psychiatric:        Mood and Affect: Mood normal.        Behavior: Behavior normal.       ED Results / Procedures / Treatments   Labs (all labs ordered are listed, but only abnormal results are displayed) Labs Reviewed - No data to display  EKG None  Radiology DG Forearm Left  Result Date: 03/31/2020 CLINICAL DATA:  Dog bite to the left forearm. EXAM: LEFT FOREARM - 2 VIEW COMPARISON:  None. FINDINGS: The cortical margins of the radius and ulna are intact. There is no  evidence of fracture or other focal bone lesions. Wrist and elbow alignment are maintained. Soft tissue irregularity about the radial aspect of the mid forearm with patchy edema and soft tissue air. No radiopaque foreign body. IMPRESSION: Soft tissue about the radial aspect of the mid forearm consistent with dog bite. No radiopaque foreign body. No acute osseous abnormality. Electronically Signed   By: Keith Rake M.D.   On: 03/31/2020 17:12    Procedures Procedures (including critical care time)  Local anesthetic to peripheral aspect of wound, total 10 cc of 1% lidocaine. Wound cleaned with betadine scrub and irrigated well. Dressing applied.  Medications Ordered in ED Medications  HYDROcodone-acetaminophen (NORCO/VICODIN) 5-325 MG per tablet 1 tablet (1 tablet Oral Given 03/31/20 1750)  lidocaine (PF) (XYLOCAINE) 1 % injection 30 mL (30 mLs Infiltration Given 03/31/20 1752)    ED Course  I have reviewed the triage vital signs and the nursing notes.  Pertinent labs & imaging results that were available during my care of the patient were reviewed by me and considered in my medical decision making (see chart for details).   Family has been in contact with Dr. Fredna Dow, who will see the patient in the office tomorrow. Wound care provided in the ED. MDM Rules/Calculators/A&P                      Patient with dog bite wound to left forearm. No bony involvement. Distal P/M/S intact with strong grip strength. Wound care provided. Wound left open for now, patient is scheduled to see Dr. Fredna Dow in the office tomorrow. Patient to remain NPO after midnight. Patient started on augmentin. Final Clinical Impression(s) / ED Diagnoses Final diagnoses:  Dog bite, initial encounter    Rx / DC Orders ED Discharge Orders         Ordered    amoxicillin-clavulanate (AUGMENTIN) 875-125 MG tablet  2 times daily     03/31/20 1819    HYDROcodone-acetaminophen (NORCO/VICODIN) 5-325 MG tablet  Every 6 hours  PRN     03/31/20 1819           Etta Quill, NP 03/31/20 1840    Long, Wonda Old[facility], MD 04/03/20 [phone]

## 2020-03-31 NOTE — Discharge Instructions (Addendum)
Please follow-up with Dr. Merlyn Lot in the office as planned for tomorrow.

## 2020-04-01 DIAGNOSIS — S51852A Open bite of left forearm, initial encounter: Secondary | ICD-10-CM | POA: Diagnosis not present

## 2020-04-05 DIAGNOSIS — S51852A Open bite of left forearm, initial encounter: Secondary | ICD-10-CM | POA: Diagnosis not present

## 2020-04-07 DIAGNOSIS — M75101 Unspecified rotator cuff tear or rupture of right shoulder, not specified as traumatic: Secondary | ICD-10-CM | POA: Diagnosis not present

## 2020-04-07 DIAGNOSIS — M25611 Stiffness of right shoulder, not elsewhere classified: Secondary | ICD-10-CM | POA: Diagnosis not present

## 2020-04-09 DIAGNOSIS — M75101 Unspecified rotator cuff tear or rupture of right shoulder, not specified as traumatic: Secondary | ICD-10-CM | POA: Diagnosis not present

## 2020-04-09 DIAGNOSIS — M25611 Stiffness of right shoulder, not elsewhere classified: Secondary | ICD-10-CM | POA: Diagnosis not present

## 2020-04-13 DIAGNOSIS — M75101 Unspecified rotator cuff tear or rupture of right shoulder, not specified as traumatic: Secondary | ICD-10-CM | POA: Diagnosis not present

## 2020-04-13 DIAGNOSIS — M25611 Stiffness of right shoulder, not elsewhere classified: Secondary | ICD-10-CM | POA: Diagnosis not present

## 2020-04-16 DIAGNOSIS — M75101 Unspecified rotator cuff tear or rupture of right shoulder, not specified as traumatic: Secondary | ICD-10-CM | POA: Diagnosis not present

## 2020-04-16 DIAGNOSIS — M25611 Stiffness of right shoulder, not elsewhere classified: Secondary | ICD-10-CM | POA: Diagnosis not present

## 2020-04-19 DIAGNOSIS — S51852D Open bite of left forearm, subsequent encounter: Secondary | ICD-10-CM | POA: Diagnosis not present

## 2020-04-29 DIAGNOSIS — R69 Illness, unspecified: Secondary | ICD-10-CM | POA: Diagnosis not present

## 2020-05-04 DIAGNOSIS — W540XXA Bitten by dog, initial encounter: Secondary | ICD-10-CM | POA: Diagnosis not present

## 2020-05-04 DIAGNOSIS — S51852A Open bite of left forearm, initial encounter: Secondary | ICD-10-CM | POA: Diagnosis not present

## 2020-05-26 DIAGNOSIS — S51852A Open bite of left forearm, initial encounter: Secondary | ICD-10-CM | POA: Diagnosis not present

## 2020-05-31 DIAGNOSIS — M25611 Stiffness of right shoulder, not elsewhere classified: Secondary | ICD-10-CM | POA: Diagnosis not present

## 2020-05-31 DIAGNOSIS — Z09 Encounter for follow-up examination after completed treatment for conditions other than malignant neoplasm: Secondary | ICD-10-CM | POA: Diagnosis not present

## 2020-07-19 DIAGNOSIS — M25611 Stiffness of right shoulder, not elsewhere classified: Secondary | ICD-10-CM | POA: Diagnosis not present

## 2020-09-23 DIAGNOSIS — R69 Illness, unspecified: Secondary | ICD-10-CM | POA: Diagnosis not present

## 2020-11-11 DIAGNOSIS — Z125 Encounter for screening for malignant neoplasm of prostate: Secondary | ICD-10-CM | POA: Diagnosis not present

## 2020-11-11 DIAGNOSIS — Z8042 Family history of malignant neoplasm of prostate: Secondary | ICD-10-CM | POA: Diagnosis not present

## 2020-11-11 DIAGNOSIS — E7800 Pure hypercholesterolemia, unspecified: Secondary | ICD-10-CM | POA: Diagnosis not present

## 2020-11-11 DIAGNOSIS — Z0000 Encounter for general adult medical examination without abnormal findings: Secondary | ICD-10-CM | POA: Diagnosis not present

## 2020-11-11 DIAGNOSIS — Z1389 Encounter for screening for other disorder: Secondary | ICD-10-CM | POA: Diagnosis not present

## 2020-11-24 DIAGNOSIS — R69 Illness, unspecified: Secondary | ICD-10-CM | POA: Diagnosis not present

## 2020-12-28 DIAGNOSIS — H5213 Myopia, bilateral: Secondary | ICD-10-CM | POA: Diagnosis not present

## 2020-12-28 DIAGNOSIS — H2513 Age-related nuclear cataract, bilateral: Secondary | ICD-10-CM | POA: Diagnosis not present

## 2021-06-23 DIAGNOSIS — R030 Elevated blood-pressure reading, without diagnosis of hypertension: Secondary | ICD-10-CM | POA: Diagnosis not present

## 2021-07-06 DIAGNOSIS — R030 Elevated blood-pressure reading, without diagnosis of hypertension: Secondary | ICD-10-CM | POA: Diagnosis not present

## 2021-11-17 DIAGNOSIS — Z125 Encounter for screening for malignant neoplasm of prostate: Secondary | ICD-10-CM | POA: Diagnosis not present

## 2021-11-17 DIAGNOSIS — Z8042 Family history of malignant neoplasm of prostate: Secondary | ICD-10-CM | POA: Diagnosis not present

## 2021-11-17 DIAGNOSIS — Z1159 Encounter for screening for other viral diseases: Secondary | ICD-10-CM | POA: Diagnosis not present

## 2021-11-17 DIAGNOSIS — E7800 Pure hypercholesterolemia, unspecified: Secondary | ICD-10-CM | POA: Diagnosis not present

## 2021-11-17 DIAGNOSIS — Z0000 Encounter for general adult medical examination without abnormal findings: Secondary | ICD-10-CM | POA: Diagnosis not present

## 2021-11-17 DIAGNOSIS — Z23 Encounter for immunization: Secondary | ICD-10-CM | POA: Diagnosis not present

## 2021-11-17 DIAGNOSIS — Z1389 Encounter for screening for other disorder: Secondary | ICD-10-CM | POA: Diagnosis not present

## 2021-11-22 ENCOUNTER — Other Ambulatory Visit (HOSPITAL_BASED_OUTPATIENT_CLINIC_OR_DEPARTMENT_OTHER): Payer: Self-pay

## 2021-11-22 ENCOUNTER — Ambulatory Visit: Payer: Medicare HMO | Attending: Internal Medicine

## 2021-11-22 DIAGNOSIS — Z23 Encounter for immunization: Secondary | ICD-10-CM

## 2021-11-22 MED ORDER — PFIZER COVID-19 VAC BIVALENT 30 MCG/0.3ML IM SUSP
INTRAMUSCULAR | 0 refills | Status: AC
  Filled 2021-11-22: qty 0.3, 1d supply, fill #0

## 2021-11-22 NOTE — Progress Notes (Signed)
° °  Covid-19 Vaccination Clinic  Name:  Donald Castaneda    MRN: [id number] DOB: 01-Apr-1956  11/22/2021  Mr. [name] was observed post Covid-19 immunization for 15 minutes without incident. He was provided with Vaccine Information Sheet and instruction to access the V-Safe system.   Mr. [name] was instructed to call 911 with any severe reactions post vaccine: Difficulty breathing  Swelling of face and throat  A fast heartbeat  A bad rash all over body  Dizziness and weakness   Immunizations Administered     Name Date Dose VIS Date Route   Pfizer Covid-19 Vaccine Bivalent Booster 11/22/2021 10:27 AM 0.3 mL 08/03/2021 Intramuscular   Manufacturer: [employer], [employer]   Lot: [id number]   NDC: [phone]

## 2022-01-04 DIAGNOSIS — H2513 Age-related nuclear cataract, bilateral: Secondary | ICD-10-CM | POA: Diagnosis not present

## 2022-01-04 DIAGNOSIS — H5213 Myopia, bilateral: Secondary | ICD-10-CM | POA: Diagnosis not present

## 2022-01-17 DIAGNOSIS — Z0100 Encounter for examination of eyes and vision without abnormal findings: Secondary | ICD-10-CM | POA: Diagnosis not present

## 2022-02-21 IMAGING — CR DG FOREARM 2V*L*
2 series · 2 of 2 positions shown · non-contrast
Comparison: None.

CLINICAL DATA: Dog bite to the left forearm.

EXAM:
LEFT FOREARM - 2 VIEW

[forearm ap]
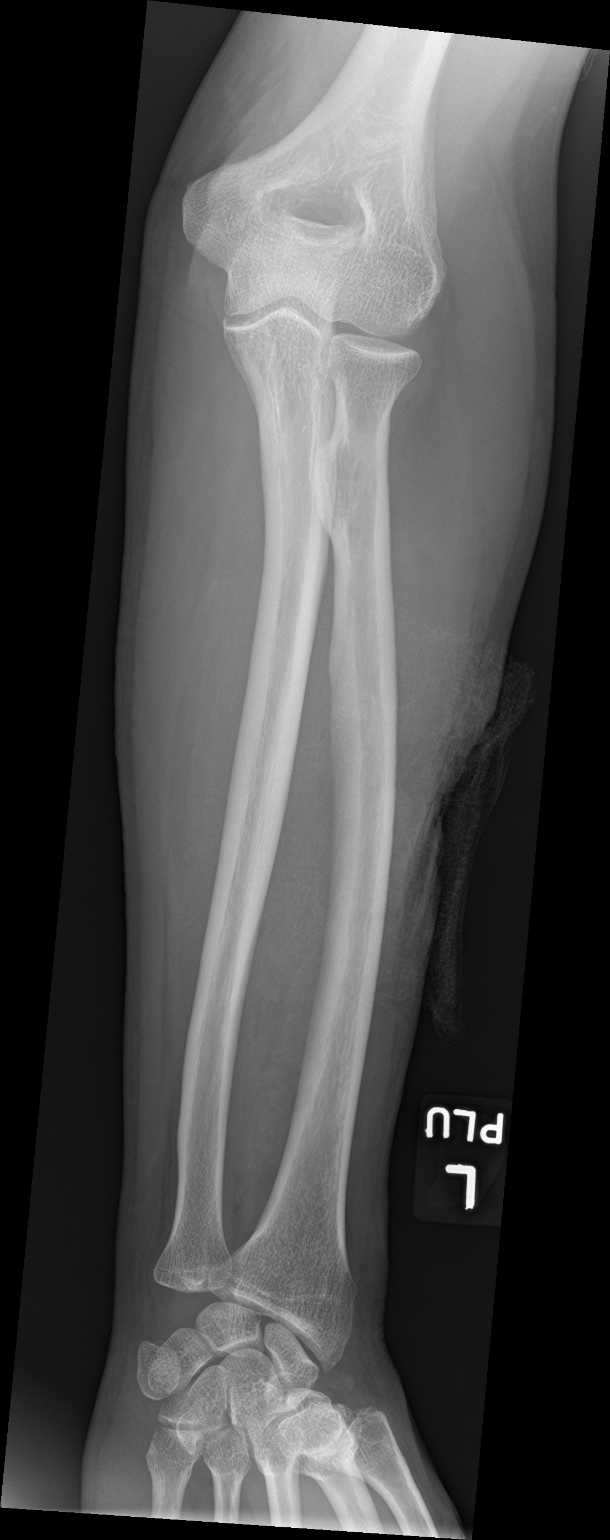

[forearm lat]
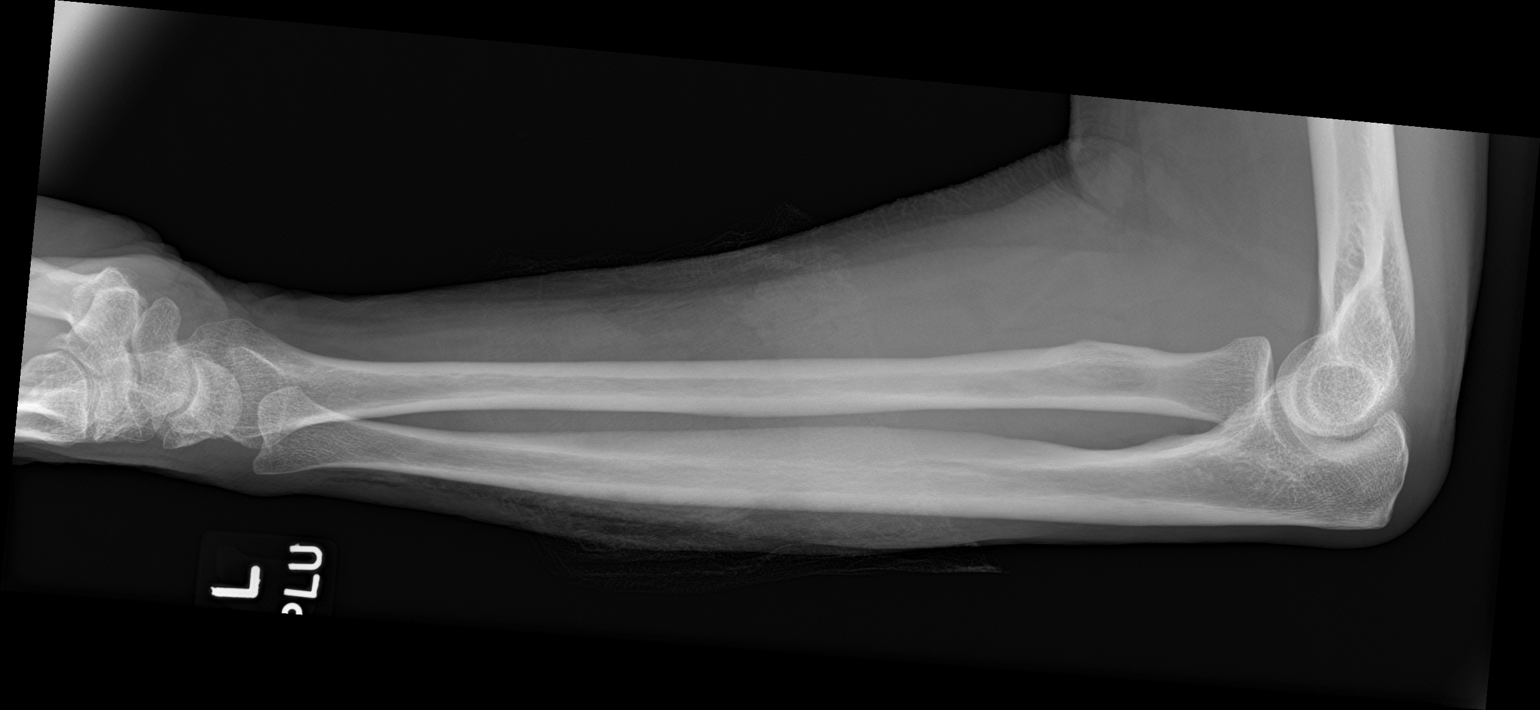

[2 of 2 positions shown; findings below may reference images not displayed]

FINDINGS: The cortical margins of the radius and ulna are intact. There is no
evidence of fracture or other focal bone lesions. Wrist and elbow
alignment are maintained. Soft tissue irregularity about the radial
aspect of the mid forearm with patchy edema and soft tissue air. No
radiopaque foreign body.
IMPRESSION: Soft tissue about the radial aspect of the mid forearm consistent
with dog bite. No radiopaque foreign body. No acute osseous
abnormality.

## 2023-01-03 DIAGNOSIS — Z8042 Family history of malignant neoplasm of prostate: Secondary | ICD-10-CM | POA: Diagnosis not present

## 2023-01-03 DIAGNOSIS — Z1331 Encounter for screening for depression: Secondary | ICD-10-CM | POA: Diagnosis not present

## 2023-01-03 DIAGNOSIS — R030 Elevated blood-pressure reading, without diagnosis of hypertension: Secondary | ICD-10-CM | POA: Diagnosis not present

## 2023-01-03 DIAGNOSIS — Z0000 Encounter for general adult medical examination without abnormal findings: Secondary | ICD-10-CM | POA: Diagnosis not present

## 2023-01-03 DIAGNOSIS — E7800 Pure hypercholesterolemia, unspecified: Secondary | ICD-10-CM | POA: Diagnosis not present

## 2023-01-03 DIAGNOSIS — Z23 Encounter for immunization: Secondary | ICD-10-CM | POA: Diagnosis not present

## 2023-01-03 DIAGNOSIS — Z5181 Encounter for therapeutic drug level monitoring: Secondary | ICD-10-CM | POA: Diagnosis not present

## 2023-01-03 DIAGNOSIS — Z125 Encounter for screening for malignant neoplasm of prostate: Secondary | ICD-10-CM | POA: Diagnosis not present

## 2023-01-17 DIAGNOSIS — H52203 Unspecified astigmatism, bilateral: Secondary | ICD-10-CM | POA: Diagnosis not present

## 2023-01-17 DIAGNOSIS — H2513 Age-related nuclear cataract, bilateral: Secondary | ICD-10-CM | POA: Diagnosis not present

## 2023-01-25 ENCOUNTER — Emergency Department (HOSPITAL_BASED_OUTPATIENT_CLINIC_OR_DEPARTMENT_OTHER)
Admission: EM | Admit: 2023-01-25 | Discharge: 2023-01-25 | Disposition: A | Discharge status: Home / Self Care | Payer: Medicare HMO | Attending: Emergency Medicine | Admitting: Emergency Medicine

## 2023-01-25 ENCOUNTER — Encounter (HOSPITAL_BASED_OUTPATIENT_CLINIC_OR_DEPARTMENT_OTHER): Payer: Self-pay

## 2023-01-25 ENCOUNTER — Other Ambulatory Visit: Payer: Self-pay

## 2023-01-25 DIAGNOSIS — R040 Epistaxis: Secondary | ICD-10-CM | POA: Diagnosis not present

## 2023-01-25 DIAGNOSIS — R000 Tachycardia, unspecified: Secondary | ICD-10-CM | POA: Insufficient documentation

## 2023-01-25 DIAGNOSIS — Z79899 Other long term (current) drug therapy: Secondary | ICD-10-CM | POA: Insufficient documentation

## 2023-01-25 DIAGNOSIS — I10 Essential (primary) hypertension: Secondary | ICD-10-CM | POA: Diagnosis not present

## 2023-01-25 DIAGNOSIS — R42 Dizziness and giddiness: Secondary | ICD-10-CM | POA: Insufficient documentation

## 2023-01-25 HISTORY — DX: Essential (primary) hypertension: I10

## 2023-01-25 LAB — CBC WITH DIFFERENTIAL/PLATELET
Abs Immature Granulocytes: 0.02 10*3/uL (ref 0.00–0.07)
Basophils Absolute: 0.1 10*3/uL (ref 0.0–0.1)
Basophils Relative: 1 %
Eosinophils Absolute: 0.3 10*3/uL (ref 0.0–0.5)
Eosinophils Relative: 4 %
HCT: 44.4 % (ref 39.0–52.0)
Hemoglobin: 15.1 g/dL (ref 13.0–17.0)
Immature Granulocytes: 0 %
Lymphocytes Relative: 26 %
Lymphs Abs: 2 10*3/uL (ref 0.7–4.0)
MCH: 30.3 pg (ref 26.0–34.0)
MCHC: 34 g/dL (ref 30.0–36.0)
MCV: 89 fL (ref 80.0–100.0)
Monocytes Absolute: 0.5 10*3/uL (ref 0.1–1.0)
Monocytes Relative: 7 %
Neutro Abs: 4.9 10*3/uL (ref 1.7–7.7)
Neutrophils Relative %: 62 %
Platelets: 291 10*3/uL (ref 150–400)
RBC: 4.99 MIL/uL (ref 4.22–5.81)
RDW: 13.4 % (ref 11.5–15.5)
WBC: 7.7 10*3/uL (ref 4.0–10.5)
nRBC: 0 % (ref 0.0–0.2)

## 2023-01-25 NOTE — ED Notes (Signed)
Pt verbalized understanding of d/c instructions, meds, and followup care. Denies questions. VSS, no distress noted. Steady gait to exit with all belongings. 

## 2023-01-25 NOTE — ED Provider Notes (Signed)
[facility] Provider Note   CSN: [id number] Arrival date & time: 01/25/23  1450     History  Chief Complaint  Patient presents with   Epistaxis    Donald Castaneda is a 67 y.o. male.   Epistaxis Patient with nosebleed.  Has had 5 of now.  Began around a week ago.  Has had 3 on Sunday and more today.  Does feel little lightheaded.  Not on blood thinners.  Recently started Norvasc.  No trauma.  Coming out of the left side primarily.  Small ongoing back of the throat but mostly anterior with some large clots.    Past Medical History:  Diagnosis Date   Hyperlipidemia    Hypertension     Home Medications Prior to Admission medications   Medication Sig Start Date End Date Taking? Authorizing Provider  amLODipine (NORVASC) 5 MG tablet Take 5 mg by mouth daily. 01/03/23   [provider]  amoxicillin-clavulanate (AUGMENTIN) 875-125 MG tablet Take 1 tablet by mouth 2 (two) times daily. One po bid x 7 days Patient not taking: Reported on 01/25/2023 03/31/20   Etta Quill, NP  atorvastatin (LIPITOR) 10 MG tablet Take 10 mg by mouth daily.    [provider]  COVID-19 mRNA bivalent vaccine, Pfizer, (PFIZER COVID-19 VAC BIVALENT) injection Inject into the muscle. 11/22/21   Carlyle Basques, MD  HYDROcodone-acetaminophen (NORCO/VICODIN) 5-325 MG tablet Take 1 tablet by mouth every 6 (six) hours as needed for severe pain. 03/31/20   Etta Quill, NP  oxyCODONE-acetaminophen (PERCOCET) 5-325 MG tablet Take 1-2 tablets every 4 hours as needed for post operative pain. MAX 6/day 01/26/20   Grier Mitts, PA-C  tiZANidine (ZANAFLEX) 4 MG tablet Take 1 tablet (4 mg total) by mouth every 8 (eight) hours as needed for muscle spasms. 01/26/20   Grier Mitts, PA-C      Allergies    Patient has no known allergies.    Review of Systems   Review of Systems  HENT:  Positive for nosebleeds.     Physical Exam Updated Vital  Signs BP (!) 152/95   Pulse (!) 122   Temp (!) 97.2 F (36.2 C) (Axillary)   Resp (!) 22   Ht 5' 9"$  (1.753 m)   Wt 78.9 kg   SpO2 99%   BMI 25.70 kg/m  Physical Exam Vitals and nursing note reviewed.  HENT:     Nose:     Comments: Active bleeding out of left nare.  Able to express large clot. Cardiovascular:     Rate and Rhythm: Regular rhythm. Tachycardia present.  Neurological:     Mental Status: He is alert.     ED Results / Procedures / Treatments   Labs (all labs ordered are listed, but only abnormal results are displayed) Labs Reviewed  CBC WITH DIFFERENTIAL/PLATELET    EKG None  Radiology No results found.  Procedures .Epistaxis Management  Date/Time: 01/25/2023 4:07 PM  Performed by: Davonna Belling, MD Authorized by: Davonna Belling, MD   Consent:    Consent obtained:  Verbal   Consent given by:  Patient   Risks, benefits, and alternatives were discussed: yes     Risks discussed:  Bleeding, infection, nasal injury and pain   Alternatives discussed:  No treatment, delayed treatment and alternative treatment Anesthesia:    Anesthesia method:  None Procedure details:    Treatment site:  L anterior   Treatment method:  Nasal balloon   Treatment complexity:  Limited   Treatment episode: initial   Post-procedure details:    Assessment:  Bleeding decreased   Procedure completion:  Tolerated well, no immediate complications     Medications Ordered in ED Medications - No data to display  ED Course/ Medical Decision Making/ A&P                             Medical Decision Making Amount and/or Complexity of Data Reviewed Labs: ordered.   Patient with epistaxis.  Has been bleeding for around a week now.  I think he would benefit from nasal balloon.  Balloon placed with improved bleeding.  However with tachycardia and lightheadedness CBC was done and hemoglobin reassuring.  Bleeding history of stopped.  May have slight ooze but appears more  serosanguineous.  Patients wife is an [employer].  They have requested to see Dr. Constance Holster and have already been contacting the office.  He also happens to be on-call today.  Will discharge home.        Final Clinical Impression(s) / ED Diagnoses Final diagnoses:  Anterior epistaxis    Rx / DC Orders ED Discharge Orders     None         Davonna Belling, MD 01/25/23 [phone]

## 2023-01-25 NOTE — ED Notes (Signed)
ENT cart at bedside. Pt sat up in bed. Applying pressure to bridge of nose. Airway remains clear and pt continues to spit blood out into emesis bad. Suction setup and available if needed.

## 2023-01-25 NOTE — ED Triage Notes (Signed)
Onset Thursday night once;  Three on Sunday.  Present nose bleed started at 0200 hr. Not on blood thinners.  Noted active bleeding in left nare with large clots

## 2023-01-29 DIAGNOSIS — R040 Epistaxis: Secondary | ICD-10-CM | POA: Diagnosis not present

## 2023-02-22 DIAGNOSIS — E7800 Pure hypercholesterolemia, unspecified: Secondary | ICD-10-CM | POA: Diagnosis not present

## 2023-03-14 DIAGNOSIS — E039 Hypothyroidism, unspecified: Secondary | ICD-10-CM | POA: Diagnosis not present

## 2023-03-14 DIAGNOSIS — I10 Essential (primary) hypertension: Secondary | ICD-10-CM | POA: Diagnosis not present

## 2023-04-25 DIAGNOSIS — E039 Hypothyroidism, unspecified: Secondary | ICD-10-CM | POA: Diagnosis not present

## 2023-06-05 DIAGNOSIS — Z79899 Other long term (current) drug therapy: Secondary | ICD-10-CM | POA: Diagnosis not present

## 2023-07-18 DIAGNOSIS — E039 Hypothyroidism, unspecified: Secondary | ICD-10-CM | POA: Diagnosis not present

## 2023-08-08 DIAGNOSIS — E039 Hypothyroidism, unspecified: Secondary | ICD-10-CM | POA: Diagnosis not present

## 2023-08-08 DIAGNOSIS — I10 Essential (primary) hypertension: Secondary | ICD-10-CM | POA: Diagnosis not present

## 2023-11-20 DIAGNOSIS — R69 Illness, unspecified: Secondary | ICD-10-CM | POA: Diagnosis not present
